# Patient Record
Sex: Male | Born: 1955 | Race: Black or African American | Hispanic: No | Marital: Single | Smoking: Current some day smoker
Health system: Southern US, Community
[De-identification: ages and names within clinical notes are randomized; demographics above are authoritative.]

## PROBLEM LIST (undated history)

## (undated) DIAGNOSIS — G629 Polyneuropathy, unspecified: Secondary | ICD-10-CM

## (undated) DIAGNOSIS — E119 Type 2 diabetes mellitus without complications: Secondary | ICD-10-CM

## (undated) DIAGNOSIS — K219 Gastro-esophageal reflux disease without esophagitis: Secondary | ICD-10-CM

## (undated) DIAGNOSIS — I10 Essential (primary) hypertension: Secondary | ICD-10-CM

## (undated) DIAGNOSIS — I219 Acute myocardial infarction, unspecified: Secondary | ICD-10-CM

## (undated) DIAGNOSIS — I509 Heart failure, unspecified: Secondary | ICD-10-CM

---

## 2003-05-30 ENCOUNTER — Emergency Department (HOSPITAL_COMMUNITY): Admission: EM | Admit: 2003-05-30 | Discharge: 2003-05-30 | Payer: Self-pay

## 2017-03-16 ENCOUNTER — Encounter (HOSPITAL_COMMUNITY): Payer: Self-pay

## 2017-03-16 ENCOUNTER — Emergency Department (HOSPITAL_COMMUNITY)
Admission: EM | Admit: 2017-03-16 | Discharge: 2017-03-16 | Disposition: A | Discharge status: Home / Self Care | Payer: Non-veteran care | Attending: Emergency Medicine | Admitting: Emergency Medicine

## 2017-03-16 DIAGNOSIS — I110 Hypertensive heart disease with heart failure: Secondary | ICD-10-CM | POA: Insufficient documentation

## 2017-03-16 DIAGNOSIS — M79675 Pain in left toe(s): Secondary | ICD-10-CM | POA: Diagnosis present

## 2017-03-16 DIAGNOSIS — Z7982 Long term (current) use of aspirin: Secondary | ICD-10-CM | POA: Insufficient documentation

## 2017-03-16 DIAGNOSIS — I252 Old myocardial infarction: Secondary | ICD-10-CM | POA: Diagnosis not present

## 2017-03-16 DIAGNOSIS — L97521 Non-pressure chronic ulcer of other part of left foot limited to breakdown of skin: Secondary | ICD-10-CM

## 2017-03-16 DIAGNOSIS — Z79899 Other long term (current) drug therapy: Secondary | ICD-10-CM | POA: Diagnosis not present

## 2017-03-16 DIAGNOSIS — Z794 Long term (current) use of insulin: Secondary | ICD-10-CM | POA: Insufficient documentation

## 2017-03-16 DIAGNOSIS — E1140 Type 2 diabetes mellitus with diabetic neuropathy, unspecified: Secondary | ICD-10-CM | POA: Insufficient documentation

## 2017-03-16 DIAGNOSIS — F17210 Nicotine dependence, cigarettes, uncomplicated: Secondary | ICD-10-CM | POA: Diagnosis not present

## 2017-03-16 DIAGNOSIS — I509 Heart failure, unspecified: Secondary | ICD-10-CM | POA: Insufficient documentation

## 2017-03-16 HISTORY — DX: Polyneuropathy, unspecified: G62.9

## 2017-03-16 HISTORY — DX: Gastro-esophageal reflux disease without esophagitis: K21.9

## 2017-03-16 HISTORY — DX: Essential (primary) hypertension: I10

## 2017-03-16 HISTORY — DX: Type 2 diabetes mellitus without complications: E11.9

## 2017-03-16 HISTORY — DX: Heart failure, unspecified: I50.9

## 2017-03-16 HISTORY — DX: Acute myocardial infarction, unspecified: I21.9

## 2017-03-16 NOTE — ED Provider Notes (Signed)
[facility] Provider Note   CSN: [id number] Arrival date & time: 03/16/17  1611     History   Chief Complaint Chief Complaint  Patient presents with  . Toe Pain    HPI Waymon Laser is a 61 y.o. male.  HPI Patient reports he's had a wound on his left great toe for several months. He is try to get treatment from the [place] but reports that he has had difficulty getting appointments and is gotten very frustrated. He reports he wants to get some treatment. He states it causes him pain. Patient reports he wears a loosefitting sneaker shoe and has not had specific trauma that he knows of. No fever chills or general illness. Significant change in the wound over the past several weeks. Past Medical History:  Diagnosis Date  . CHF (congestive heart failure) (HCC)   . Diabetes mellitus without complication (HCC)   . Gastric reflux   . Hypertension   . MI (myocardial infarction)   . Neuropathy (HCC)     There are no active problems to display for this patient.   History reviewed. No pertinent surgical history.     Home Medications    Prior to Admission medications   Medication Sig Start Date End Date Taking? Authorizing Provider  aspirin 81 MG chewable tablet Chew 81 mg by mouth daily.   Yes Historical Provider, MD  atorvastatin (LIPITOR) 20 MG tablet Take 20 mg by mouth at bedtime.   Yes Historical Provider, MD  carvedilol (COREG) 6.25 MG tablet Take 6.25 mg by mouth 2 (two) times daily with a meal.   Yes Historical Provider, MD  furosemide (LASIX) 40 MG tablet Take 40 mg by mouth 2 (two) times daily.   Yes Historical Provider, MD  gabapentin (NEURONTIN) 400 MG capsule Take 800 mg by mouth 3 (three) times daily.   Yes Historical Provider, MD  insulin aspart (NOVOLOG) 100 UNIT/ML injection Inject 10 Units into the skin 2 (two) times daily before a meal.   Yes Historical Provider, MD  insulin glargine (LANTUS) 100 UNIT/ML injection Inject 20 Units into the skin daily.   Yes  Historical Provider, MD  lisinopril (PRINIVIL,ZESTRIL) 2.5 MG tablet Take 2.5 mg by mouth daily.   Yes Historical Provider, MD  pantoprazole (PROTONIX) 40 MG tablet Take 40 mg by mouth daily before breakfast.   Yes Historical Provider, MD  torsemide (DEMADEX) 20 MG tablet Take 20 mg by mouth 2 (two) times daily.   Yes Historical Provider, MD    Family History Family History  Problem Relation Age of Onset  . Anemia Mother   . Hypertension Mother   . Chronic Renal Failure Mother   . Cancer Father     Social History Social History  Substance Use Topics  . Smoking status: Current Some Day Smoker    Types: Cigarettes  . Smokeless tobacco: Never Used  . Alcohol use No     Allergies   Patient has no known allergies.   Review of Systems Review of Systems 10 Systems reviewed and are negative for acute change except as noted in the HPI.   Physical Exam Updated Vital Signs BP (!) 129/97 (BP Location: Left Arm)   Pulse 90   Temp 98.4 F (36.9 C) (Oral)   Resp 16   Ht 6' (1.829 m)   Wt 151 lb 2 oz (68.5 kg)   SpO2 99%   BMI 20.50 kg/m   Physical Exam  Constitutional: He is oriented to person, place, and  time. He appears well-developed and well-nourished. No distress.  HENT:  Head: Normocephalic and atraumatic.  Eyes: EOM are normal.  Pulmonary/Chest: Effort normal.  Musculoskeletal:  Patient has approximately 1+ edema bilateral lower extremities. He has skin thinning and hairlessness. Toes have dry thickened nails. There are some dry scaling skin on the soles of the feet and the toes. The great toe has a 1 cm, dry hard eschar on the tip and slightly to the medial aspect. The tip of the toe has a firm hard callus. Patient does not exhibit any pain as I palpate and press on this wound. There is no fluctuance or erythema. No signs of any purulent material beneath the eschar. Completely dry and firm.  Neurological: He is alert and oriented to person, place, and time. No cranial  nerve deficit. He exhibits normal muscle tone. Coordination normal.  Skin: Skin is warm and dry.  Psychiatric: He has a normal mood and affect.     ED Treatments / Results  Labs (all labs ordered are listed, but only abnormal results are displayed) Labs Reviewed - No data to display  EKG  EKG Interpretation None       Radiology No results found.  Procedures Procedures (including critical care time)  Medications Ordered in ED Medications - No data to display   Initial Impression / Assessment and Plan / ED Course  I have reviewed the triage vital signs and the nursing notes.  Pertinent labs & imaging results that were available during my care of the patient were reviewed by me and considered in my medical decision making (see chart for details).      Final Clinical Impressions(s) / ED Diagnoses   Final diagnoses:  Skin ulcer of toe of left foot, limited to breakdown of skin (HCC)   Patient has a dry firm ulcer on his toe without any signs of secondary infection or gangrenous changes. The ulceration is limited to the tip of the toe. Patient is diabetic. By exam I suspect peripheral vascular disease. Skin is thin and shiny with dryness and scaling of the foot. This has been chronic and stable. Patient is frustrated by inability to get timely follow-up care at the [place]. Time I have counseled him on protecting the ulcer with a dry dressing, avoiding injury to it and avoiding soaking or applying emollients to the wound. He is given information for contacting wound care clinic in podiatry locally. New Prescriptions New Prescriptions   No medications on file     Arby Barrette, MD 03/16/17 [phone]

## 2017-03-16 NOTE — ED Notes (Signed)
Pt toe clean with clorhexadine cleanse and  dry gauze dressing applied.

## 2017-03-16 NOTE — ED Notes (Signed)
Bed: [facility] Expected date:  Expected time:  Means of arrival:  Comments: 

## 2017-03-16 NOTE — ED Notes (Signed)
Bed: [facility] Expected date:  Expected time:  Means of arrival:  Comments: 

## 2017-03-16 NOTE — ED Notes (Signed)
Md at bedside at this time

## 2017-03-16 NOTE — ED Notes (Signed)
Pt stating he needs to leave now and does not have time to sign for his dc papers or get dc vitals. Pt did allow this RN to review dc paperwork however and he understands how to follow up

## 2017-03-16 NOTE — ED Triage Notes (Signed)
Per EMS- Patient has been residing at [facility]. Patient has swollen great and second toe on the left foot.. Patient has a history of diabetes.

## 2017-04-09 ENCOUNTER — Encounter (HOSPITAL_COMMUNITY): Payer: Self-pay

## 2017-04-09 ENCOUNTER — Emergency Department (HOSPITAL_COMMUNITY)
Admission: EM | Admit: 2017-04-09 | Discharge: 2017-04-09 | Discharge status: Against Medical Advice | Payer: Non-veteran care | Attending: Emergency Medicine | Admitting: Emergency Medicine

## 2017-04-09 ENCOUNTER — Emergency Department (HOSPITAL_COMMUNITY): Payer: Non-veteran care

## 2017-04-09 DIAGNOSIS — Z7982 Long term (current) use of aspirin: Secondary | ICD-10-CM | POA: Diagnosis not present

## 2017-04-09 DIAGNOSIS — I509 Heart failure, unspecified: Secondary | ICD-10-CM | POA: Diagnosis not present

## 2017-04-09 DIAGNOSIS — I110 Hypertensive heart disease with heart failure: Secondary | ICD-10-CM | POA: Insufficient documentation

## 2017-04-09 DIAGNOSIS — F17210 Nicotine dependence, cigarettes, uncomplicated: Secondary | ICD-10-CM | POA: Insufficient documentation

## 2017-04-09 DIAGNOSIS — E1140 Type 2 diabetes mellitus with diabetic neuropathy, unspecified: Secondary | ICD-10-CM | POA: Diagnosis not present

## 2017-04-09 DIAGNOSIS — Z794 Long term (current) use of insulin: Secondary | ICD-10-CM | POA: Insufficient documentation

## 2017-04-09 DIAGNOSIS — I252 Old myocardial infarction: Secondary | ICD-10-CM | POA: Diagnosis not present

## 2017-04-09 DIAGNOSIS — E1165 Type 2 diabetes mellitus with hyperglycemia: Secondary | ICD-10-CM | POA: Insufficient documentation

## 2017-04-09 DIAGNOSIS — R739 Hyperglycemia, unspecified: Secondary | ICD-10-CM

## 2017-04-09 DIAGNOSIS — R600 Localized edema: Secondary | ICD-10-CM | POA: Diagnosis not present

## 2017-04-09 LAB — CBC
HCT: 38.9 % — ABNORMAL LOW (ref 39.0–52.0)
HEMOGLOBIN: 12.7 g/dL — AB (ref 13.0–17.0)
MCH: 30.8 pg (ref 26.0–34.0)
MCHC: 32.6 g/dL (ref 30.0–36.0)
MCV: 94.4 fL (ref 78.0–100.0)
PLATELETS: 191 10*3/uL (ref 150–400)
RBC: 4.12 MIL/uL — AB (ref 4.22–5.81)
RDW: 14.3 % (ref 11.5–15.5)
WBC: 5.4 10*3/uL (ref 4.0–10.5)

## 2017-04-09 LAB — BASIC METABOLIC PANEL
ANION GAP: 9 (ref 5–15)
BUN: 33 mg/dL — ABNORMAL HIGH (ref 6–20)
CALCIUM: 8.2 mg/dL — AB (ref 8.9–10.3)
CO2: 26 mmol/L (ref 22–32)
CREATININE: 1.63 mg/dL — AB (ref 0.61–1.24)
Chloride: 100 mmol/L — ABNORMAL LOW (ref 101–111)
GFR, EST AFRICAN AMERICAN: 51 mL/min — AB (ref >60)
GFR, EST NON AFRICAN AMERICAN: 44 mL/min — AB (ref >60)
Glucose, Bld: 421 mg/dL — ABNORMAL HIGH (ref 65–99)
POTASSIUM: 4.6 mmol/L (ref 3.5–5.1)
Sodium: 135 mmol/L (ref 135–145)

## 2017-04-09 LAB — CBG MONITORING, ED: GLUCOSE-CAPILLARY: 417 mg/dL — AB (ref 65–99)

## 2017-04-09 MED ORDER — INSULIN ASPART 100 UNIT/ML ~~LOC~~ SOLN
10.0000 [IU] | Freq: Once | SUBCUTANEOUS | Status: AC
  Administered 2017-04-09: 10 [IU] via SUBCUTANEOUS
  Filled 2017-04-09: qty 1

## 2017-04-09 MED ORDER — HYDROCODONE-ACETAMINOPHEN 5-325 MG PO TABS
1.0000 | ORAL_TABLET | Freq: Once | ORAL | Status: AC
  Administered 2017-04-09: 1 via ORAL
  Filled 2017-04-09: qty 1

## 2017-04-09 NOTE — ED Notes (Signed)
Pt discharged by doctor, but left before me could receive d/c instructions

## 2017-04-09 NOTE — ED Provider Notes (Signed)
[facility] Provider Note   CSN: [id number] Arrival date & time: 04/09/17  0144  By signing my name below, I, Bing Neighbors., attest that this documentation has been prepared under the direction and in the presence of Zadie Rhine, MD. Electronically signed: Bing Neighbors., ED Scribe. 04/09/17. 2:24 AM.    History   Chief Complaint Chief Complaint  Patient presents with  . Knee Pain    Left  . Hyperglycemia    HPI Russell Foster is a 61 y.o. male with hx of diabetes mellitus who presents to the Emergency Department bib [facility] complaining of L knee pain with onset x2 days. Pt states that he was at [facility] and has since had increasing L knee pain . He states that when he put weight on the L knee he fell to the floor. He states that the L knee is TTP. Pt denies any modifying factors. He denies fever, vomiting, chest pain, SOB and vomiting. Of note, pt is being seen at the [facility] in x2 days to check his lower extremities for blood clots. Pt denies ever being treated for DVT. He reportedly refused taking his insulin today. Pt states that [facility].   HPI  Past Medical History:  Diagnosis Date  . CHF (congestive heart failure) (HCC)   . Diabetes mellitus without complication (HCC)   . Gastric reflux   . Hypertension   . MI (myocardial infarction) (HCC)   . Neuropathy     There are no active problems to display for this patient.   History reviewed. No pertinent surgical history.     Home Medications    Prior to Admission medications   Medication Sig Start Date End Date Taking? Authorizing Provider  aspirin 81 MG chewable tablet Chew 81 mg by mouth daily.    Historical Provider, MD  atorvastatin (LIPITOR) 20 MG tablet Take 20 mg by mouth at bedtime.    Historical Provider, MD  carvedilol (COREG) 6.25 MG tablet Take 6.25 mg by mouth 2 (two) times daily with a meal.    Historical Provider, MD    furosemide (LASIX) 40 MG tablet Take 40 mg by mouth 2 (two) times daily.    Historical Provider, MD  gabapentin (NEURONTIN) 400 MG capsule Take 800 mg by mouth 3 (three) times daily.    Historical Provider, MD  insulin aspart (NOVOLOG) 100 UNIT/ML injection Inject 10 Units into the skin 2 (two) times daily before a meal.    Historical Provider, MD  insulin glargine (LANTUS) 100 UNIT/ML injection Inject 20 Units into the skin daily.    Historical Provider, MD  lisinopril (PRINIVIL,ZESTRIL) 2.5 MG tablet Take 2.5 mg by mouth daily.    Historical Provider, MD  pantoprazole (PROTONIX) 40 MG tablet Take 40 mg by mouth daily before breakfast.    Historical Provider, MD  torsemide (DEMADEX) 20 MG tablet Take 20 mg by mouth 2 (two) times daily.    Historical Provider, MD    Family History Family History  Problem Relation Age of Onset  . Anemia Mother   . Hypertension Mother   . Chronic Renal Failure Mother   . Cancer Father     Social History Social History  Substance Use Topics  . Smoking status: Current Some Day Smoker    Types: Cigarettes  . Smokeless tobacco: Never Used  . Alcohol use No     Allergies   Patient has no known allergies.  Review of Systems Review of Systems  Constitutional: Negative for fever.  Respiratory: Negative for shortness of breath.   Cardiovascular: Positive for leg swelling. Negative for chest pain.  Gastrointestinal: Negative for nausea and vomiting.  Musculoskeletal: Positive for arthralgias (L knee).  All other systems reviewed and are negative.    Physical Exam Updated Vital Signs BP (!) 138/100 (BP Location: Left Arm)   Pulse 100   Temp 98 F (36.7 C) (Oral)   Resp 16   SpO2 99%   Physical Exam  CONSTITUTIONAL: Chronically ill-appearing but no acute distress HEAD: Normocephalic/atraumatic EYES: EOMI/PERRL ENMT: Mucous membranes moist NECK: supple no meningeal signs SPINE/BACK:entire spine nontender CV: S1/S2 noted, no  murmurs/rubs/gallops noted LUNGS: Lungs are clear to auscultation bilaterally, no apparent distress ABDOMEN: soft, nontender GU:no cva tenderness NEURO: Pt is awake/alert/appropriate, moves all extremitiesx4.  No facial droop.   EXTREMITIES: pulses normal/equal, full ROM, tenderness and edema to L lower extremity, edema is greater in the L lower extremity, no deformity or effusion noted to L knee. No erythema noted to lower extremity SKIN: warm, color normal PSYCH: no abnormalities of mood noted, alert and oriented to situation  ED Treatments / Results   DIAGNOSTIC STUDIES: Oxygen Saturation is 99% on RA, normal by my interpretation.   COORDINATION OF CARE: 2:25 AM-Discussed next steps with pt. Pt verbalized understanding and is agreeable with the plan.    Labs (all labs ordered are listed, but only abnormal results are displayed) Labs Reviewed  BASIC METABOLIC PANEL - Abnormal; Notable for the following:       Result Value   Chloride 100 (*)    Glucose, Bld 421 (*)    BUN 33 (*)    Creatinine, Ser 1.63 (*)    Calcium 8.2 (*)    GFR calc non Af Amer 44 (*)    GFR calc Af Amer 51 (*)    All other components within normal limits  CBC - Abnormal; Notable for the following:    RBC 4.12 (*)    Hemoglobin 12.7 (*)    HCT 38.9 (*)    All other components within normal limits  CBG MONITORING, ED - Abnormal; Notable for the following:    Glucose-Capillary 417 (*)    All other components within normal limits  URINALYSIS, ROUTINE W REFLEX MICROSCOPIC    EKG  EKG Interpretation None       Radiology Dg Knee Complete 4 Views Left  Result Date: 04/09/2017 CLINICAL DATA:  Posterior knee pain for 1 week with swelling of the knee. EXAM: LEFT KNEE - COMPLETE 4+ VIEW COMPARISON:  None. FINDINGS: Soft tissue induration and swelling is noted about the visualized thigh, knee and proximal calf. There is medial femorotibial joint space narrowing with minimal spurring of the lateral  tibial spine. No joint effusion. No acute fracture bone destruction. Suggestion of mild periosteum reaction along the proximal tibial shaft raising the question of venous stasis. IMPRESSION: Nonspecific diffuse soft tissue swelling about the left knee question venous stasis given periosteal thickening along the tibial shaft. Electronically Signed   By: Tollie Eth M.D.   On: 04/09/2017 03:12    Procedures Procedures (including critical care time)  Medications Ordered in ED Medications  HYDROcodone-acetaminophen (NORCO/VICODIN) 5-325 MG per tablet 1 tablet (1 tablet Oral Given 04/09/17 0309)  insulin aspart (novoLOG) injection 10 Units (10 Units Subcutaneous Given 04/09/17 0309)     Initial Impression / Assessment and Plan / ED Course  I have reviewed the triage vital  signs and the nursing notes.  Pertinent labs & imaging results that were available during my care of the patient were reviewed by me and considered in my medical decision making (see chart for details).     4:18 AM Review of chart Pt seen at [facility] on 4/21 DVT study of left LE - negative He was noted to be in CHF and was diuresed at that time  When considering recent workup and his clinical appearance tonight, pt appears near baseline He did have hyperglycemia and was given insulin However, xray was negative for fracture and no clinical signs of cellulitis Since he had recent negative DVT study, this was not re-ordered I feel he is appropriate for d/c home  At time of discharge, he requested narcotic pain meds, but when I declined to prescribe this and advised PCP followup he became very angry and left ED prior to my discharge paperwork was given    Final Clinical Impressions(s) / ED Diagnoses   Final diagnoses:  Edema of left lower extremity  Hyperglycemia    New Prescriptions New Prescriptions   No medications on file   I personally performed the services described in this documentation, which was scribed  in my presence. The recorded information has been reviewed and is accurate.       Zadie Rhine, MD 04/09/17 [phone]

## 2017-04-09 NOTE — ED Notes (Signed)
Pt came to [facility], asked for doctor. Stood their for several minutes. Didn't make any indication of being in pain.

## 2017-04-09 NOTE — ED Notes (Signed)
Pt in hallway in bathrobe walking out, when asked if they would like to talk to the doctor before they left, pt stated "the doctor can kiss my ass". "I am never coming back to this place with this racist ass doctor." Iv removed before leaving.

## 2017-04-09 NOTE — ED Notes (Signed)
CBG: 417 

## 2017-04-09 NOTE — ED Triage Notes (Signed)
BIB EMS from [facility] w/ c/o increased pain and swelling to left knee. Upon EMS arrival to scene pts BSG 535. Pt refused taking his insulin today.   EMS Vitals BP 138/103 P 99 SPO2 100%

## 2017-04-19 ENCOUNTER — Encounter (HOSPITAL_COMMUNITY): Payer: Self-pay | Admitting: Emergency Medicine

## 2017-04-19 ENCOUNTER — Emergency Department (HOSPITAL_COMMUNITY): Payer: Non-veteran care

## 2017-04-19 ENCOUNTER — Observation Stay (HOSPITAL_COMMUNITY)
Admission: EM | Admit: 2017-04-19 | Discharge: 2017-04-20 | Discharge status: Against Medical Advice | Payer: Non-veteran care | Attending: Internal Medicine | Admitting: Internal Medicine

## 2017-04-19 DIAGNOSIS — E1165 Type 2 diabetes mellitus with hyperglycemia: Secondary | ICD-10-CM | POA: Diagnosis present

## 2017-04-19 DIAGNOSIS — I130 Hypertensive heart and chronic kidney disease with heart failure and stage 1 through stage 4 chronic kidney disease, or unspecified chronic kidney disease: Secondary | ICD-10-CM | POA: Insufficient documentation

## 2017-04-19 DIAGNOSIS — Z7982 Long term (current) use of aspirin: Secondary | ICD-10-CM | POA: Diagnosis not present

## 2017-04-19 DIAGNOSIS — R4182 Altered mental status, unspecified: Secondary | ICD-10-CM | POA: Diagnosis not present

## 2017-04-19 DIAGNOSIS — E1140 Type 2 diabetes mellitus with diabetic neuropathy, unspecified: Secondary | ICD-10-CM | POA: Insufficient documentation

## 2017-04-19 DIAGNOSIS — Z79899 Other long term (current) drug therapy: Secondary | ICD-10-CM | POA: Diagnosis not present

## 2017-04-19 DIAGNOSIS — F17210 Nicotine dependence, cigarettes, uncomplicated: Secondary | ICD-10-CM | POA: Diagnosis not present

## 2017-04-19 DIAGNOSIS — E1122 Type 2 diabetes mellitus with diabetic chronic kidney disease: Secondary | ICD-10-CM | POA: Diagnosis not present

## 2017-04-19 DIAGNOSIS — I5042 Chronic combined systolic (congestive) and diastolic (congestive) heart failure: Secondary | ICD-10-CM | POA: Insufficient documentation

## 2017-04-19 DIAGNOSIS — I252 Old myocardial infarction: Secondary | ICD-10-CM | POA: Diagnosis not present

## 2017-04-19 DIAGNOSIS — N183 Chronic kidney disease, stage 3 unspecified: Secondary | ICD-10-CM | POA: Diagnosis present

## 2017-04-19 DIAGNOSIS — R739 Hyperglycemia, unspecified: Secondary | ICD-10-CM

## 2017-04-19 DIAGNOSIS — Z9119 Patient's noncompliance with other medical treatment and regimen: Secondary | ICD-10-CM | POA: Insufficient documentation

## 2017-04-19 DIAGNOSIS — Z8249 Family history of ischemic heart disease and other diseases of the circulatory system: Secondary | ICD-10-CM | POA: Insufficient documentation

## 2017-04-19 DIAGNOSIS — Z809 Family history of malignant neoplasm, unspecified: Secondary | ICD-10-CM | POA: Insufficient documentation

## 2017-04-19 DIAGNOSIS — Z832 Family history of diseases of the blood and blood-forming organs and certain disorders involving the immune mechanism: Secondary | ICD-10-CM | POA: Diagnosis not present

## 2017-04-19 DIAGNOSIS — Z794 Long term (current) use of insulin: Secondary | ICD-10-CM | POA: Insufficient documentation

## 2017-04-19 DIAGNOSIS — G92 Toxic encephalopathy: Secondary | ICD-10-CM | POA: Diagnosis not present

## 2017-04-19 DIAGNOSIS — N289 Disorder of kidney and ureter, unspecified: Secondary | ICD-10-CM

## 2017-04-19 DIAGNOSIS — G929 Unspecified toxic encephalopathy: Secondary | ICD-10-CM

## 2017-04-19 DIAGNOSIS — F14929 Cocaine use, unspecified with intoxication, unspecified: Secondary | ICD-10-CM | POA: Diagnosis present

## 2017-04-19 DIAGNOSIS — K219 Gastro-esophageal reflux disease without esophagitis: Secondary | ICD-10-CM | POA: Insufficient documentation

## 2017-04-19 LAB — DIFFERENTIAL
BASOS ABS: 0 10*3/uL (ref 0.0–0.1)
Basophils Relative: 0 %
EOS ABS: 0.1 10*3/uL (ref 0.0–0.7)
EOS PCT: 1 %
LYMPHS ABS: 1.2 10*3/uL (ref 0.7–4.0)
Lymphocytes Relative: 22 %
Monocytes Absolute: 0.5 10*3/uL (ref 0.1–1.0)
Monocytes Relative: 9 %
NEUTROS PCT: 68 %
Neutro Abs: 3.6 10*3/uL (ref 1.7–7.7)

## 2017-04-19 LAB — CBC
HCT: 41.6 % (ref 39.0–52.0)
Hemoglobin: 13.1 g/dL (ref 13.0–17.0)
MCH: 30.7 pg (ref 26.0–34.0)
MCHC: 31.5 g/dL (ref 30.0–36.0)
MCV: 97.4 fL (ref 78.0–100.0)
PLATELETS: 201 10*3/uL (ref 150–400)
RBC: 4.27 MIL/uL (ref 4.22–5.81)
RDW: 14.5 % (ref 11.5–15.5)
WBC: 5.4 10*3/uL (ref 4.0–10.5)

## 2017-04-19 LAB — BRAIN NATRIURETIC PEPTIDE: B NATRIURETIC PEPTIDE 5: 3965.5 pg/mL — AB (ref 0.0–100.0)

## 2017-04-19 NOTE — ED Provider Notes (Signed)
[facility]-EMERGENCY DEPT Provider Note   CSN: [id number] Arrival date & time: 04/19/17  2238  By signing my name below, I, Linna DarnerRussell Turner, attest that this documentation has been prepared under the direction and in the presence of physician practitioner, Dione BoozeGlick, Junaid Wurzer, MD. Electronically Signed: Linna Darnerussell Turner, Scribe. 04/19/2017. 11:15 PM.  History   Chief Complaint Chief Complaint  Patient presents with  . Leg Swelling  . Hyperglycemia   The history is provided by the patient. No language interpreter was used.    HPI Comments: LEVEL 5 CAVEAT FOR ALTERED MENTAL STATUS Russell Foster is a 61 y.o. male with PMHx including DM, CHF, HTN, and MI who presents to the Emergency Department via EMS with complaints of bilateral lower leg swelling and hyperglycemia for an unspecified period of time. Per the nursing staff, patient was A&O x4 en route with EMS tonight but has been "non-compliant" and disoriented since arrival to the ED. There are no further complaints noted as patient is refusing to answer questions during evaluation.   Past Medical History:  Diagnosis Date  . CHF (congestive heart failure) (HCC)   . Diabetes mellitus without complication (HCC)   . Gastric reflux   . Hypertension   . MI (myocardial infarction) (HCC)   . Neuropathy     There are no active problems to display for this patient.   History reviewed. No pertinent surgical history.     Home Medications    Prior to Admission medications   Medication Sig Start Date End Date Taking? Authorizing Provider  aspirin 81 MG chewable tablet Chew 81 mg by mouth daily.    [provider]  atorvastatin (LIPITOR) 20 MG tablet Take 20 mg by mouth at bedtime.    [provider]  carvedilol (COREG) 6.25 MG tablet Take 6.25 mg by mouth 2 (two) times daily with a meal.    [provider]  furosemide (LASIX) 40 MG tablet Take 40 mg by mouth 2 (two) times daily.    [provider]  gabapentin  (NEURONTIN) 400 MG capsule Take 800 mg by mouth 3 (three) times daily.    [provider]  insulin aspart (NOVOLOG) 100 UNIT/ML injection Inject 10 Units into the skin 2 (two) times daily before a meal.    [provider]  insulin glargine (LANTUS) 100 UNIT/ML injection Inject 20 Units into the skin daily.    [provider]  lisinopril (PRINIVIL,ZESTRIL) 2.5 MG tablet Take 2.5 mg by mouth daily.    [provider]  pantoprazole (PROTONIX) 40 MG tablet Take 40 mg by mouth daily before breakfast.    [provider]  torsemide (DEMADEX) 20 MG tablet Take 20 mg by mouth 2 (two) times daily.    [provider]    Family History Family History  Problem Relation Age of Onset  . Anemia Mother   . Hypertension Mother   . Chronic Renal Failure Mother   . Cancer Father     Social History Social History  Substance Use Topics  . Smoking status: Current Some Day Smoker    Types: Cigarettes  . Smokeless tobacco: Never Used  . Alcohol use No     Allergies   Patient has no known allergies.   Review of Systems Review of Systems  Unable to perform ROS: Mental status change   Physical Exam Updated Vital Signs BP (!) 143/101   Pulse 93   Temp 97.9 F (36.6 C) (Oral)   Resp 20   SpO2 100%  Physical Exam  Constitutional: He appears well-developed and well-nourished.  HENT:  Head: Normocephalic and atraumatic.  Eyes: EOM are normal. Pupils are equal, round, and reactive to light.  Neck: Normal range of motion. Neck supple. JVD present.  Cardiovascular: Normal rate, regular rhythm and normal heart sounds.   No murmur heard. Pulmonary/Chest: Effort normal and breath sounds normal. He has no wheezes. He has no rales. He exhibits no tenderness.  Abdominal: Soft. Bowel sounds are normal. He exhibits no distension and no mass. There is tenderness. There is no rebound and no guarding.  Mild tenderness across the upper abdomen.    Musculoskeletal: Normal range of motion. He exhibits edema.  1-2+ pretibial edema with symmetric calf circumference.  Lymphadenopathy:    He has no cervical adenopathy.  Neurological: He is alert. No cranial nerve deficit. He exhibits normal muscle tone. Coordination normal.  Uncooperative but oriented to person. Will not answer questions. No focal neurologic findings.  Skin: Skin is warm and dry. No rash noted.  Nursing note and vitals reviewed.  ED Treatments / Results  Labs (all labs ordered are listed, but only abnormal results are displayed) Labs Reviewed  BASIC METABOLIC PANEL - Abnormal; Notable for the following:       Result Value   Sodium 126 (*)    Potassium 5.6 (*)    Chloride 91 (*)    Glucose, Bld 776 (*)    BUN 48 (*)    Creatinine, Ser 1.85 (*)    Calcium 8.6 (*)    GFR calc non Af Amer 38 (*)    GFR calc Af Amer 44 (*)    All other components within normal limits  URINALYSIS, ROUTINE W REFLEX MICROSCOPIC - Abnormal; Notable for the following:    Glucose, UA >=500 (*)    Hgb urine dipstick SMALL (*)    Protein, ur 30 (*)    Squamous Epithelial / LPF 0-5 (*)    All other components within normal limits  HEPATIC FUNCTION PANEL - Abnormal; Notable for the following:    Albumin 3.1 (*)    AST 43 (*)    Alkaline Phosphatase 182 (*)    Bilirubin, Direct 0.6 (*)    All other components within normal limits  RAPID URINE DRUG SCREEN, HOSP PERFORMED - Abnormal; Notable for the following:    Cocaine POSITIVE (*)    All other components within normal limits  BRAIN NATRIURETIC PEPTIDE - Abnormal; Notable for the following:    B Natriuretic Peptide 3,965.5 (*)    All other components within normal limits  CBG MONITORING, ED - Abnormal; Notable for the following:    Glucose-Capillary >600 (*)    All other components within normal limits  CBG MONITORING, ED - Abnormal; Notable for the following:    Glucose-Capillary 568 (*)    All other components within normal  limits  CBC  LIPASE, BLOOD  DIFFERENTIAL  TROPONIN I  TROPONIN I  TROPONIN I    EKG  EKG Interpretation  Date/Time:  Sunday Apr 20 2017 03:25:14 EDT Ventricular Rate:  89 PR Interval:    QRS Duration: 114 QT Interval:  395 QTC Calculation: 481 R Axis:   -33 Text Interpretation:  Sinus rhythm Probable left atrial enlargement Abnormal R-wave progression, late transition LVH with secondary repolarization abnormality Borderline prolonged QT interval Baseline wander in lead(s) V1 V3 V4 V5 V6 No old tracing to compare Confirmed by [facility]  MD, Daaiyah Baumert ([id number]) on 04/20/2017 3:32:17 AM  Radiology Ct Head Wo Contrast  Result Date: 04/20/2017 CLINICAL DATA:  Bilateral lower leg swelling and hyperglycemia, disoriented EXAM: CT HEAD WITHOUT CONTRAST TECHNIQUE: Contiguous axial images were obtained from the base of the skull through the vertex without intravenous contrast. COMPARISON:  None. FINDINGS: Brain: No hemorrhage or intracranial mass is visualized. There is a wedge-shaped focal hypodensity in the left cerebellum. Mild atrophy. The ventricles are nonenlarged. Vascular: No hyperdense vessels. Scattered calcifications at the carotid siphons. Skull: Normal. Negative for fracture or focal lesion. Sinuses/Orbits: No acute finding.  Possible old nasal bone deformity Other: None IMPRESSION: Small focal hypodensity in the left cerebellum is suspicious for a focal area of edema or possible infarct. MRI is recommended for further evaluation. Electronically Signed   By: Jasmine Pang M.D.   On: 04/20/2017 02:51   Dg Chest Port 1 View  Result Date: 04/20/2017 CLINICAL DATA:  Bilateral lower extremity swelling. Hyperglycemia. Some disorientation tonight. EXAM: PORTABLE CHEST 1 VIEW COMPARISON:  None. FINDINGS: Moderate cardiomegaly, unchanged. The lungs are clear. The pulmonary vasculature is normal. Hilar and mediastinal contours are unremarkable and unchanged. No pleural effusions. IMPRESSION: Stable  cardiomegaly.  No consolidation or effusion. Electronically Signed   By: Ellery Plunk M.D.   On: 04/20/2017 00:07    Procedures Procedures (including critical care time) CRITICAL CARE Performed by: WGNFA,OZHYQ Total critical care time: 45 minutes Critical care time was exclusive of separately billable procedures and treating other patients. Critical care was necessary to treat or prevent imminent or life-threatening deterioration. Critical care was time spent personally by me on the following activities: development of treatment plan with patient and/or surrogate as well as nursing, discussions with consultants, evaluation of patient's response to treatment, examination of patient, obtaining history from patient or surrogate, ordering and performing treatments and interventions, ordering and review of laboratory studies, ordering and review of radiographic studies, pulse oximetry and re-evaluation of patient's condition.  DIAGNOSTIC STUDIES: Oxygen Saturation is 100% on RA, normal by my interpretation.    Medications Ordered in ED Medications  dextrose 5 %-0.45 % sodium chloride infusion (not administered)  insulin regular bolus via infusion 0-10 Units (not administered)  insulin regular (NOVOLIN R,HUMULIN R) 250 Units in sodium chloride 0.9 % 250 mL (1 Units/mL) infusion (10.2 Units/hr Intravenous Rate/Dose Change 04/20/17 0255)  dextrose 50 % solution 25 mL (not administered)  0.9 %  sodium chloride infusion ( Intravenous New Bag/Given 04/20/17 0201)     Initial Impression / Assessment and Plan / ED Course  I have reviewed the triage vital signs and the nursing notes.  Pertinent labs & imaging results that were available during my care of the patient were reviewed by me and considered in my medical decision making (see chart for details).  Patient presented with complaints of leg swelling but is uncooperative on exam. Findings are suggestive of possible CHF with neck vein distention,  and mild tenderness across the upper abdomen. Review of old records shows that he has been evaluated for leg swelling secondary to CHF in the past. Recent admission at [facility] for non-STEMI secondary to cocaine abuse. Long-standing history of cardiac myopathy with decreased ejection fraction. He is noted to be hyperglycemic in the ED but does not demonstrate small respirations. Screening labs are obtained including cardiac and GI labs. Will hold off on fluids until I can better ascertain his fluid status.  Laboratory workup shows significant hyperglycemia without evidence of ketoacidosis or hyperosmolar state. He continues to have altered mentation, so will  be sent for CT of head. Drug screen was positive for cocaine but not other medications. Creatinine is mildly elevated and this is significantly above his baseline. Anticipate need for inpatient management to optimize his creatinine. BNP is markedly elevated, and I do not feel he needs significant hydration at this point.  CT is significant for possible left cerebellar infarct. He continues to be poorly responsive when I can't a CMP over, nursing reports that in between, he has gotten up and tried to eat some sausages, and MB related to the bathroom without difficulty. At this point, I feel that he needs to be admitted to control his blood sugar and to further evaluate his mental status and CT findings. Case is discussed with Dr. Julian Reil [facility] who agrees to admit the patient.  Final Clinical Impressions(s) / ED Diagnoses   Final diagnoses:  Hyperglycemia  Renal insufficiency  Altered mental status, unspecified altered mental status type    New Prescriptions New Prescriptions   No medications on file   I personally performed the services described in this documentation, which was scribed in my presence. The recorded information has been reviewed and is accurate.      Dione Booze, MD 04/20/17 [phone]

## 2017-04-19 NOTE — ED Notes (Signed)
Bed: WA07 Expected date:  Expected time:  Means of arrival:  Comments: EMS 3461 M Hyperglycemia and left leg edema

## 2017-04-19 NOTE — ED Triage Notes (Signed)
Patient BIB [facility] from home for left leg edema and pain as well as high blood sugar. CBG red HIGH with EMS. History of DM. A/O x4.

## 2017-04-20 ENCOUNTER — Encounter (HOSPITAL_COMMUNITY): Payer: Self-pay | Admitting: Emergency Medicine

## 2017-04-20 ENCOUNTER — Encounter (HOSPITAL_COMMUNITY): Payer: Self-pay | Admitting: Oncology

## 2017-04-20 ENCOUNTER — Emergency Department (HOSPITAL_COMMUNITY)
Admission: EM | Admit: 2017-04-20 | Discharge: 2017-04-20 | Disposition: A | Discharge status: Home / Self Care | Payer: Non-veteran care | Source: Home / Self Care | Attending: Emergency Medicine | Admitting: Emergency Medicine

## 2017-04-20 ENCOUNTER — Emergency Department (HOSPITAL_COMMUNITY): Payer: Non-veteran care

## 2017-04-20 ENCOUNTER — Emergency Department (HOSPITAL_COMMUNITY)
Admission: EM | Admit: 2017-04-20 | Discharge: 2017-04-20 | Disposition: A | Discharge status: Home / Self Care | Payer: Non-veteran care | Attending: Emergency Medicine | Admitting: Emergency Medicine

## 2017-04-20 DIAGNOSIS — F14929 Cocaine use, unspecified with intoxication, unspecified: Secondary | ICD-10-CM

## 2017-04-20 DIAGNOSIS — Z79899 Other long term (current) drug therapy: Secondary | ICD-10-CM | POA: Insufficient documentation

## 2017-04-20 DIAGNOSIS — Z794 Long term (current) use of insulin: Secondary | ICD-10-CM

## 2017-04-20 DIAGNOSIS — N183 Chronic kidney disease, stage 3 unspecified: Secondary | ICD-10-CM | POA: Diagnosis present

## 2017-04-20 DIAGNOSIS — E1165 Type 2 diabetes mellitus with hyperglycemia: Secondary | ICD-10-CM

## 2017-04-20 DIAGNOSIS — G92 Toxic encephalopathy: Secondary | ICD-10-CM | POA: Diagnosis not present

## 2017-04-20 DIAGNOSIS — Z7982 Long term (current) use of aspirin: Secondary | ICD-10-CM | POA: Insufficient documentation

## 2017-04-20 DIAGNOSIS — M2550 Pain in unspecified joint: Secondary | ICD-10-CM

## 2017-04-20 DIAGNOSIS — I5042 Chronic combined systolic (congestive) and diastolic (congestive) heart failure: Secondary | ICD-10-CM

## 2017-04-20 DIAGNOSIS — I130 Hypertensive heart and chronic kidney disease with heart failure and stage 1 through stage 4 chronic kidney disease, or unspecified chronic kidney disease: Secondary | ICD-10-CM | POA: Insufficient documentation

## 2017-04-20 DIAGNOSIS — F17210 Nicotine dependence, cigarettes, uncomplicated: Secondary | ICD-10-CM | POA: Insufficient documentation

## 2017-04-20 DIAGNOSIS — G929 Unspecified toxic encephalopathy: Secondary | ICD-10-CM | POA: Diagnosis present

## 2017-04-20 DIAGNOSIS — I509 Heart failure, unspecified: Secondary | ICD-10-CM

## 2017-04-20 DIAGNOSIS — I252 Old myocardial infarction: Secondary | ICD-10-CM

## 2017-04-20 DIAGNOSIS — N289 Disorder of kidney and ureter, unspecified: Secondary | ICD-10-CM | POA: Diagnosis not present

## 2017-04-20 DIAGNOSIS — E1122 Type 2 diabetes mellitus with diabetic chronic kidney disease: Secondary | ICD-10-CM

## 2017-04-20 DIAGNOSIS — E1140 Type 2 diabetes mellitus with diabetic neuropathy, unspecified: Secondary | ICD-10-CM | POA: Insufficient documentation

## 2017-04-20 DIAGNOSIS — R4182 Altered mental status, unspecified: Secondary | ICD-10-CM

## 2017-04-20 DIAGNOSIS — R739 Hyperglycemia, unspecified: Secondary | ICD-10-CM

## 2017-04-20 DIAGNOSIS — Z5900 Homelessness unspecified: Secondary | ICD-10-CM

## 2017-04-20 DIAGNOSIS — F329 Major depressive disorder, single episode, unspecified: Secondary | ICD-10-CM | POA: Insufficient documentation

## 2017-04-20 DIAGNOSIS — F32A Depression, unspecified: Secondary | ICD-10-CM

## 2017-04-20 DIAGNOSIS — R609 Edema, unspecified: Secondary | ICD-10-CM

## 2017-04-20 LAB — URINALYSIS, ROUTINE W REFLEX MICROSCOPIC
Bacteria, UA: NONE SEEN
Bilirubin Urine: NEGATIVE
Ketones, ur: NEGATIVE mg/dL
LEUKOCYTES UA: NEGATIVE
Nitrite: NEGATIVE
PH: 5 (ref 5.0–8.0)
Protein, ur: 30 mg/dL — AB
Specific Gravity, Urine: 1.024 (ref 1.005–1.030)

## 2017-04-20 LAB — RAPID URINE DRUG SCREEN, HOSP PERFORMED
AMPHETAMINES: NOT DETECTED
BARBITURATES: NOT DETECTED
BENZODIAZEPINES: NOT DETECTED
Cocaine: POSITIVE — AB
Opiates: NOT DETECTED
Tetrahydrocannabinol: NOT DETECTED

## 2017-04-20 LAB — BASIC METABOLIC PANEL
Anion gap: 9 (ref 5–15)
BUN: 48 mg/dL — AB (ref 6–20)
CALCIUM: 8.6 mg/dL — AB (ref 8.9–10.3)
CO2: 26 mmol/L (ref 22–32)
Chloride: 91 mmol/L — ABNORMAL LOW (ref 101–111)
Creatinine, Ser: 1.85 mg/dL — ABNORMAL HIGH (ref 0.61–1.24)
GFR calc Af Amer: 44 mL/min — ABNORMAL LOW (ref >60)
GFR, EST NON AFRICAN AMERICAN: 38 mL/min — AB (ref >60)
GLUCOSE: 776 mg/dL — AB (ref 65–99)
POTASSIUM: 5.6 mmol/L — AB (ref 3.5–5.1)
Sodium: 126 mmol/L — ABNORMAL LOW (ref 135–145)

## 2017-04-20 LAB — HEPATIC FUNCTION PANEL
ALBUMIN: 3.1 g/dL — AB (ref 3.5–5.0)
ALK PHOS: 182 U/L — AB (ref 38–126)
ALT: 28 U/L (ref 17–63)
AST: 43 U/L — ABNORMAL HIGH (ref 15–41)
Bilirubin, Direct: 0.6 mg/dL — ABNORMAL HIGH (ref 0.1–0.5)
Indirect Bilirubin: 0.4 mg/dL (ref 0.3–0.9)
TOTAL PROTEIN: 7.2 g/dL (ref 6.5–8.1)
Total Bilirubin: 1 mg/dL (ref 0.3–1.2)

## 2017-04-20 LAB — CBG MONITORING, ED
GLUCOSE-CAPILLARY: 568 mg/dL — AB (ref 65–99)
Glucose-Capillary: >600 mg/dL (ref 65–99)
Glucose-Capillary: 311 mg/dL — ABNORMAL HIGH (ref 65–99)

## 2017-04-20 LAB — LIPASE, BLOOD: Lipase: 41 U/L (ref 11–51)

## 2017-04-20 MED ORDER — DEXTROSE 50 % IV SOLN: 25.0000 mL | INTRAVENOUS | Status: DC | PRN

## 2017-04-20 MED ORDER — DEXTROSE-NACL 5-0.45 % IV SOLN: INTRAVENOUS | Status: DC

## 2017-04-20 MED ORDER — PANTOPRAZOLE SODIUM 40 MG PO TBEC: 40.0000 mg | DELAYED_RELEASE_TABLET | Freq: Every day | ORAL | Status: DC

## 2017-04-20 MED ORDER — INSULIN REGULAR HUMAN 100 UNIT/ML IJ SOLN
INTRAMUSCULAR | Status: DC
  Administered 2017-04-20: 5.4 [IU]/h via INTRAVENOUS
  Filled 2017-04-20: qty 2.5

## 2017-04-20 MED ORDER — INSULIN REGULAR BOLUS VIA INFUSION
0.0000 [IU] | Freq: Three times a day (TID) | INTRAVENOUS | Status: DC
  Filled 2017-04-20: qty 10

## 2017-04-20 MED ORDER — ENOXAPARIN SODIUM 40 MG/0.4ML ~~LOC~~ SOLN: 40.0000 mg | SUBCUTANEOUS | Status: DC

## 2017-04-20 MED ORDER — SODIUM CHLORIDE 0.9 % IV SOLN
INTRAVENOUS | Status: DC
  Administered 2017-04-20: 02:00:00 via INTRAVENOUS

## 2017-04-20 MED ORDER — ATORVASTATIN CALCIUM 20 MG PO TABS: 20.0000 mg | ORAL_TABLET | Freq: Every day | ORAL | Status: DC

## 2017-04-20 MED ORDER — LISINOPRIL 2.5 MG PO TABS: 2.5000 mg | ORAL_TABLET | Freq: Every day | ORAL | Status: DC

## 2017-04-20 MED ORDER — ASPIRIN 81 MG PO CHEW: 81.0000 mg | CHEWABLE_TABLET | Freq: Every day | ORAL | Status: DC

## 2017-04-20 NOTE — ED Notes (Signed)
Pt has in his belongings bag a pair of white and red sneakers, a pair of blue pants in his pockets he has two dollars a watch shoe polish and military id and regular id. He has a black cell phone and a blue shirt.

## 2017-04-20 NOTE — ED Notes (Signed)
Bed: WA04 Expected date:  Expected time:  Means of arrival:  Comments: Ems 

## 2017-04-20 NOTE — H&P (Signed)
History and Physical    Russell Foster ZOX:[id number] DOB: September 14, 1956 DOA: 04/19/2017  PCP: Patient, No Pcp Per  Patient coming from: Home  I have personally briefly [facility]  Chief Complaint: AMS  HPI: Russell Foster is a 61 y.o. male with medical history significant of DM, CHF, HTN, and MI who presents to the Emergency Department via EMS with complaints of bilateral lower leg swelling and hyperglycemia for an unspecified period of time. Per the nursing staff, patient was A&O x4 en route with EMS tonight but has been "non-compliant" and disoriented since arrival to the ED. There are no further complaints noted as patient is refusing to answer questions during evaluation.   ED Course: CT head shows incidental finding of hypodensity in cerebellum.  Patient has ambulated at least once to the bathroom while in ED and was eating earlier.  He has also urinated on the floor of his room and refused to answer questions.   Review of Systems: Unable to perform due to AMS  Past Medical History:  Diagnosis Date  . CHF (congestive heart failure) (HCC)   . Diabetes mellitus without complication (HCC)   . Gastric reflux   . Hypertension   . MI (myocardial infarction) (HCC)   . Neuropathy     History reviewed. No pertinent surgical history.   reports that he has been smoking Cigarettes.  He has never used smokeless tobacco. He reports that he does not drink alcohol or use drugs.  No Known Allergies  Family History  Problem Relation Age of Onset  . Anemia Mother   . Hypertension Mother   . Chronic Renal Failure Mother   . Cancer Father      Prior to Admission medications   Medication Sig Start Date End Date Taking? Authorizing Provider  aspirin 81 MG chewable tablet Chew 81 mg by mouth daily.    [provider]  atorvastatin (LIPITOR) 20 MG tablet Take 20 mg by mouth at bedtime.    [provider]  carvedilol (COREG)  6.25 MG tablet Take 6.25 mg by mouth 2 (two) times daily with a meal.    [provider]  furosemide (LASIX) 40 MG tablet Take 40 mg by mouth 2 (two) times daily.    [provider]  gabapentin (NEURONTIN) 400 MG capsule Take 800 mg by mouth 3 (three) times daily.    [provider]  insulin aspart (NOVOLOG) 100 UNIT/ML injection Inject 10 Units into the skin 2 (two) times daily before a meal.    [provider]  insulin glargine (LANTUS) 100 UNIT/ML injection Inject 20 Units into the skin daily.    [provider]  lisinopril (PRINIVIL,ZESTRIL) 2.5 MG tablet Take 2.5 mg by mouth daily.    [provider]  pantoprazole (PROTONIX) 40 MG tablet Take 40 mg by mouth daily before breakfast.    [provider]  torsemide (DEMADEX) 20 MG tablet Take 20 mg by mouth 2 (two) times daily.    [provider]    Physical Exam: Vitals:   04/19/17 2248 04/20/17 0035 04/20/17 0130 04/20/17 0200  BP: (!) 143/101 (!) 139/104 124/89 (!) 141/102  Pulse: 93 94 91 90  Resp: 20 18    Temp: 97.9 F (36.6 C)     TempSrc: Oral     SpO2: 100% 96% 98% 92%    Constitutional: NAD, calm, comfortable Eyes: PERRL, lids and conjunctivae normal ENMT: Mucous membranes are moist. Posterior pharynx  clear of any exudate or lesions.Normal dentition.  Neck: normal, supple, no masses, no thyromegaly Respiratory: clear to auscultation bilaterally, no wheezing, no crackles. Normal respiratory effort. No accessory muscle use.  Cardiovascular: Regular rate and rhythm, no murmurs / rubs / gallops. No extremity edema. 2+ pedal pulses. No carotid bruits.  Abdomen: no tenderness, no masses palpated. No hepatosplenomegaly. Bowel sounds positive.  Musculoskeletal: no clubbing / cyanosis. No joint deformity upper and lower extremities. Good ROM, no contractures. Normal muscle tone.  Skin: no rashes, lesions, ulcers. No induration Neurologic: Grossly  non-focal Psychiatric: Not answering questions at this time.   Labs on Admission: I have personally reviewed following labs and imaging studies  CBC:  Recent Labs Lab 04/19/17 2245  WBC 5.4  NEUTROABS 3.6  HGB 13.1  HCT 41.6  MCV 97.4  PLT 201   Basic Metabolic Panel:  Recent Labs Lab 04/19/17 2245  NA 126*  K 5.6*  CL 91*  CO2 26  GLUCOSE 776*  BUN 48*  CREATININE 1.85*  CALCIUM 8.6*   GFR: CrCl cannot be calculated (Unknown ideal weight.). Liver Function Tests:  Recent Labs Lab 04/19/17 2245  AST 43*  ALT 28  ALKPHOS 182*  BILITOT 1.0  PROT 7.2  ALBUMIN 3.1*    Recent Labs Lab 04/19/17 2245  LIPASE 41   No results for input(s): AMMONIA in the last 168 hours. Coagulation Profile: No results for input(s): INR, PROTIME in the last 168 hours. Cardiac Enzymes: No results for input(s): CKTOTAL, CKMB, CKMBINDEX, TROPONINI in the last 168 hours. BNP (last 3 results) No results for input(s): PROBNP in the last 8760 hours. HbA1C: No results for input(s): HGBA1C in the last 72 hours. CBG:  Recent Labs Lab 04/20/17 0121 04/20/17 0249  GLUCAP >600* 568*   Lipid Profile: No results for input(s): CHOL, HDL, LDLCALC, TRIG, CHOLHDL, LDLDIRECT in the last 72 hours. Thyroid Function Tests: No results for input(s): TSH, T4TOTAL, FREET4, T3FREE, THYROIDAB in the last 72 hours. Anemia Panel: No results for input(s): VITAMINB12, FOLATE, FERRITIN, TIBC, IRON, RETICCTPCT in the last 72 hours. Urine analysis:    Component Value Date/Time   COLORURINE YELLOW 04/19/2017 2245   APPEARANCEUR CLEAR 04/19/2017 2245   LABSPEC 1.024 04/19/2017 2245   PHURINE 5.0 04/19/2017 2245   GLUCOSEU >=500 (A) 04/19/2017 2245   HGBUR SMALL (A) 04/19/2017 2245   BILIRUBINUR NEGATIVE 04/19/2017 2245   KETONESUR NEGATIVE 04/19/2017 2245   PROTEINUR 30 (A) 04/19/2017 2245   NITRITE NEGATIVE 04/19/2017 2245   LEUKOCYTESUR NEGATIVE 04/19/2017 2245    Radiological Exams on  Admission: Ct Head Wo Contrast  Result Date: 04/20/2017 CLINICAL DATA:  Bilateral lower leg swelling and hyperglycemia, disoriented EXAM: CT HEAD WITHOUT CONTRAST TECHNIQUE: Contiguous axial images were obtained from the base of the skull through the vertex without intravenous contrast. COMPARISON:  None. FINDINGS: Brain: No hemorrhage or intracranial mass is visualized. There is a wedge-shaped focal hypodensity in the left cerebellum. Mild atrophy. The ventricles are nonenlarged. Vascular: No hyperdense vessels. Scattered calcifications at the carotid siphons. Skull: Normal. Negative for fracture or focal lesion. Sinuses/Orbits: No acute finding.  Possible old nasal bone deformity Other: None IMPRESSION: Small focal hypodensity in the left cerebellum is suspicious for a focal area of edema or possible infarct. MRI is recommended for further evaluation. Electronically Signed   By: Jasmine Pang M.D.   On: 04/20/2017 02:51   Dg Chest Port 1 View  Result Date: 04/20/2017 CLINICAL DATA:  Bilateral lower extremity swelling. Hyperglycemia. Some  disorientation tonight. EXAM: PORTABLE CHEST 1 VIEW COMPARISON:  None. FINDINGS: Moderate cardiomegaly, unchanged. The lungs are clear. The pulmonary vasculature is normal. Hilar and mediastinal contours are unremarkable and unchanged. No pleural effusions. IMPRESSION: Stable cardiomegaly.  No consolidation or effusion. Electronically Signed   By: Ellery Plunk M.D.   On: 04/20/2017 00:07    EKG: Independently reviewed.  Assessment/Plan Principal Problem:   Toxic encephalopathy Active Problems:   CKD (chronic kidney disease) stage 3, GFR 30-59 ml/min   Cocaine intoxication (HCC)   Hyperglycemia due to type 2 diabetes mellitus (HCC)   Chronic combined systolic and diastolic CHF (congestive heart failure) (HCC)    1. Toxic encephalopathy - strongly suspect cocaine / psych induced encephalopathy 1. However will admit for MRI brain to rule out stroke given CT  findings 2. Hyperglycemia due to DM2 - 1. glucostabilizer 2. Will "hydrate" patient by holding lasix 3. However will hold off on IVF for the moment given his extensive cardiac history, EF of 15% etc. 3. CHF - continue home meds except lasix 4. CKD stage 3 - chronic and stable.  DVT prophylaxis: Lovenox Code Status: Full Family Communication: No family in room Disposition Plan: Home when ready Consults called: None Admission status: Place in 62, Heywood Iles. DO [facility] Pager [phone]  If 7AM-7PM, please contact day team taking care of patient [email or web address] Password [email or web address]  04/20/2017, 3:21 AM

## 2017-04-20 NOTE — ED Notes (Signed)
Patient refused to sign discharge information stating we need to keep him here so he can eat. Advised that cafeteria was open however at this time he was medically discharged. Patient closed his eyes and would not respond when spoken to stating "are you still here"

## 2017-04-20 NOTE — ED Provider Notes (Signed)
[facility]-EMERGENCY DEPT Provider Note   CSN: [id number] Arrival date & time: 04/20/17  1137     History   Chief Complaint Chief Complaint  Patient presents with  . Suicidal  . Joint Swelling    HPI Russell Carns is a 61 y.o. male.  HPI Patient reports ongoing recurrent joint pain.  He was seen in the emergency department earlier this morning and discharged.  EMS was called secondary to ongoing left knee swelling.  He states no one seems to be helping him and he feels like he might just need to hurt himself if no one can help him.  He is homeless.  He states he has nowhere to go.  He reports she's been walking for 2 days.  He was recently a homeless shelter and was kicked out of the homeless shelter.  He states he's hungry and tired and needs to rest and needs to sleep.  He has no plan of suicide.  He's never tried to kill himself before.  He reports ongoing left knee pain chronically   Past Medical History:  Diagnosis Date  . CHF (congestive heart failure) (HCC)   . Diabetes mellitus without complication (HCC)   . Gastric reflux   . Hypertension   . MI (myocardial infarction) (HCC)   . Neuropathy     Patient Active Problem List   Diagnosis Date Noted  . CKD (chronic kidney disease) stage 3, GFR 30-59 ml/min 04/20/2017  . Cocaine intoxication (HCC) 04/20/2017  . Toxic encephalopathy 04/20/2017  . Hyperglycemia due to type 2 diabetes mellitus (HCC) 04/20/2017  . Chronic combined systolic and diastolic CHF (congestive heart failure) (HCC) 04/20/2017    History reviewed. No pertinent surgical history.     Home Medications    Prior to Admission medications   Medication Sig Start Date End Date Taking? Authorizing Provider  aspirin 81 MG chewable tablet Chew 81 mg by mouth daily.   Yes [provider]  atorvastatin (LIPITOR) 20 MG tablet Take 20 mg by mouth at bedtime.   Yes [provider]  carvedilol (COREG) 6.25 MG tablet Take 6.25 mg by mouth 2 (two)  times daily with a meal.   Yes [provider]  furosemide (LASIX) 80 MG tablet Take 80 mg by mouth 2 (two) times daily. 04/07/17 05/07/17 Yes [provider]  gabapentin (NEURONTIN) 400 MG capsule Take 800 mg by mouth 3 (three) times daily.   Yes [provider]  insulin glargine (LANTUS) 100 UNIT/ML injection Inject 20 Units into the skin daily.   Yes [provider]  insulin NPH-regular Human (NOVOLIN 70/30) (70-30) 100 UNIT/ML injection Inject 10 Units into the skin 2 (two) times daily. 03/04/17  Yes [provider]  pantoprazole (PROTONIX) 40 MG tablet Take 40 mg by mouth daily before breakfast.   Yes [provider]  sertraline (ZOLOFT) 25 MG tablet Take 25 mg by mouth daily. 03/05/17 03/05/18 Yes [provider]  torsemide (DEMADEX) 20 MG tablet Take 20 mg by mouth 2 (two) times daily.   Yes [provider]    Family History Family History  Problem Relation Age of Onset  . Anemia Mother   . Hypertension Mother   . Chronic Renal Failure Mother   . Cancer Father     Social History Social History  Substance Use Topics  . Smoking status: Current Some Day Smoker    Types: Cigarettes  . Smokeless tobacco: Never Used  . Alcohol use No     Allergies  Patient has no known allergies.   Review of Systems Review of Systems  All other systems reviewed and are negative.    Physical Exam Updated Vital Signs BP 109/75 (BP Location: Left Arm)   Pulse 95   Temp 98.8 F (37.1 C) (Oral)   Resp 18   SpO2 93%   Physical Exam  Constitutional: He is oriented to person, place, and time. He appears well-developed and well-nourished.  HENT:  Head: Normocephalic.  Eyes: EOM are normal.  Neck: Normal range of motion.  Pulmonary/Chest: Effort normal.  Abdominal: He exhibits no distension.  Musculoskeletal: Normal range of motion.  Full range of motion bilateral hips, knees, ankles.  Normal pulses bilaterally.  No  significant swelling appreciated of the left knee.  No warmth or erythema.  Neurological: He is alert and oriented to person, place, and time.  Psychiatric: He has a normal mood and affect.  Nursing note and vitals reviewed.    ED Treatments / Results  Labs (all labs ordered are listed, but only abnormal results are displayed) Labs Reviewed - No data to display  EKG  EKG Interpretation Russell Foster       Radiology Ct Head Wo Contrast  Result Date: 04/20/2017 CLINICAL DATA:  Bilateral lower leg swelling and hyperglycemia, disoriented EXAM: CT HEAD WITHOUT CONTRAST TECHNIQUE: Contiguous axial images were obtained from the base of the skull through the vertex without intravenous contrast. COMPARISON:  Russell Foster. FINDINGS: Brain: No hemorrhage or intracranial mass is visualized. There is a wedge-shaped focal hypodensity in the left cerebellum. Mild atrophy. The ventricles are nonenlarged. Vascular: No hyperdense vessels. Scattered calcifications at the carotid siphons. Skull: Normal. Negative for fracture or focal lesion. Sinuses/Orbits: No acute finding.  Possible old nasal bone deformity Other: Russell Foster IMPRESSION: Small focal hypodensity in the left cerebellum is suspicious for a focal area of edema or possible infarct. MRI is recommended for further evaluation. Electronically Signed   By: Jasmine PangKim  Fujinaga M.D.   On: 04/20/2017 02:51   Dg Chest Port 1 View  Result Date: 04/20/2017 CLINICAL DATA:  Bilateral lower extremity swelling. Hyperglycemia. Some disorientation tonight. EXAM: PORTABLE CHEST 1 VIEW COMPARISON:  Russell Foster. FINDINGS: Moderate cardiomegaly, unchanged. The lungs are clear. The pulmonary vasculature is normal. Hilar and mediastinal contours are unremarkable and unchanged. No pleural effusions. IMPRESSION: Stable cardiomegaly.  No consolidation or effusion. Electronically Signed   By: Ellery Plunkaniel R Mitchell M.D.   On: 04/20/2017 00:07    Procedures Procedures (including critical care time)  Medications  Ordered in ED Medications - No data to display   Initial Impression / Assessment and Plan / ED Course  I have reviewed the triage vital signs and the nursing notes.  Pertinent labs & imaging results that were available during my care of the patient were reviewed by me and considered in my medical decision making (see chart for details).     I do not believe the patient is a threat to himself or others.  I think he has recurrent pain which is part of his presentation today.  I also think he is homeless and is hungry and this is also leading to his vague comments regarding suicidal thoughts.  He cannot collaborate on this any further.  He has no specific plan.  I do think the patient is safe for discharge home.  [facility] to [facility].  He does not want additional resources for other homeless shelters in the community.  He states he just wants to sleep  Final Clinical Impressions(s) / ED  Diagnoses   Final diagnoses:  Depression, unspecified depression type  Homeless  Arthralgia, unspecified joint    New Prescriptions New Prescriptions   No medications on file     Azalia Bilis, MD 04/20/17 1232

## 2017-04-20 NOTE — ED Notes (Addendum)
Pt is A&O x 4. Pt ambulatory w/ a steady gait.  No sx of CVA. Pt is urinating in the floor.  Cursing the staff.  Dr. Julian ReilGardner is present and aware that pt is leaving AMA.  Pt refusing VS, or to sign AMA form.  Attempted to explain the risks of leaving AMA to which pt responded, "Shut your fucking mouth, and get your stinking breath out my face."  "I'm leaving."  Will d/c AMA.

## 2017-04-20 NOTE — Discharge Instructions (Signed)
Increase your torsemide to two tablets at a time (40 mg) twice a day for the next three days. After that, you will need to get instructions from your doctor at the [facility].

## 2017-04-20 NOTE — ED Notes (Signed)
Patient refusing to leave. Given food on way out. Escorted to bus stop with security and [employer]. Discharge instructions reviewed and placed in patients belonging bag.

## 2017-04-20 NOTE — ED Notes (Signed)
Pt now wishes to be admitted to the hospital. Pt very recently left AMA after being admitted for a possible stroke. Patient is stating they have pain in their left leg. Pt is asking for peanut butter during triage.

## 2017-04-20 NOTE — ED Notes (Signed)
Bed: WA21 Expected date:  Expected time:  Means of arrival:  Comments: RM 7

## 2017-04-20 NOTE — ED Provider Notes (Signed)
[facility]-EMERGENC[facility] DEPT Provider Note   CSN: [id number] Arrival date & time: 04/20/17  0406     History   Chief Complaint Chief Complaint  Patient presents with  . Hyperglycemia    HPI Russell Foster is a 61 y.o. male.  He is a very poor and uncooperative historian. He comes in complaining of pain and swelling in his legs but will not say for how long it has been bothering him. He had been in the emergency department earlier tonight with severe hyperglycemia and altered mental status. CT had shown a questionable cerebellar lesion and arrangements had been made to admit him. However, he got up and walked to the bathroom and then left AGAINST MEDICAL ADVICE. He returns but his not able to explain to me why he has returned. He does have a prescription for torsemide and states that he has been taking it twice a day as prescribed.   The history is provided by the patient and medical records.  Hyperglycemia    Past Medical History:  Diagnosis Date  . CHF (congestive heart failure) (HCC)   . Diabetes mellitus without complication (HCC)   . Gastric reflux   . Hypertension   . MI (myocardial infarction) (HCC)   . Neuropathy     Patient Active Problem List   Diagnosis Date Noted  . CKD (chronic kidney disease) stage 3, GFR 30-59 ml/min 04/20/2017  . Cocaine intoxication (HCC) 04/20/2017  . Toxic encephalopathy 04/20/2017  . Hyperglycemia due to type 2 diabetes mellitus (HCC) 04/20/2017  . Chronic combined systolic and diastolic CHF (congestive heart failure) (HCC) 04/20/2017    History reviewed. No pertinent surgical history.     Home Medications    Prior to Admission medications   Medication Sig Start Date End Date Taking? Authorizing Provider  aspirin 81 MG chewable tablet Chew 81 mg by mouth daily.    [provider]  atorvastatin (LIPITOR) 20 MG tablet Take 20 mg by mouth at bedtime.    [provider]  carvedilol (COREG) 6.25 MG tablet Take 6.25 mg by  mouth 2 (two) times daily with a meal.    [provider]  gabapentin (NEURONTIN) 400 MG capsule Take 800 mg by mouth 3 (three) times daily.    [provider]  insulin aspart (NOVOLOG) 100 UNIT/ML injection Inject 10 Units into the skin 2 (two) times daily before a meal.    [provider]  insulin glargine (LANTUS) 100 UNIT/ML injection Inject 20 Units into the skin daily.    [provider]  lisinopril (PRINIVIL,ZESTRIL) 2.5 MG tablet Take 2.5 mg by mouth daily.    [provider]  pantoprazole (PROTONIX) 40 MG tablet Take 40 mg by mouth daily before breakfast.    [provider]  torsemide (DEMADEX) 20 MG tablet Take 20 mg by mouth 2 (two) times daily.    [provider]    Family History Family History  Problem Relation Age of Onset  . Anemia Mother   . Hypertension Mother   . Chronic Renal Failure Mother   . Cancer Father     Social History Social History  Substance Use Topics  . Smoking status: Current Some Day Smoker    Types: Cigarettes  . Smokeless tobacco: Never Used  . Alcohol use No     Allergies   Patient has no known allergies.   Review of Systems Review of Systems  All other systems reviewed and are negative.    Physical Exam Updated Vital  Signs BP (!) 130/98 (BP Location: Right Arm)   Pulse 91   SpO2 95%   Physical Exam  Nursing note and vitals reviewed.  61 year old male, resting comfortably and in no acute distress. Vital signs are Significant for hypertension. Oxygen saturation is 95%, which is normal. Head is normocephalic and atraumatic. PERRLA, EOMI. Oropharynx is clear. Neck is nontender and supple without adenopathy. JVD is present. Back is nontender and there is no CVA tenderness. Lungs are clear without rales, wheezes, or rhonchi. Chest is nontender. Heart has regular rate and rhythm without murmur. Abdomen is soft, flat, nontender without masses or hepatosplenomegaly and  peristalsis is normoactive. Extremities have 1-2 edema, full range of motion is present. Circumference is symmetric. There is mild tenderness to palpation diffusely through the lower legs. Skin is warm and dry without rash. Neurologic: Mental status is normal, cranial nerves are intact, there are no motor or sensory deficits.  ED Treatments / Results  Labs (all labs ordered are listed, but only abnormal results are displayed) Labs Reviewed  CBG MONITORING, ED - Abnormal; Notable for the following:       Result Value   Glucose-Capillary 311 (*)    All other components within normal limits    Procedures Procedures (including critical care time)  Medications Ordered in ED Medications - No data to display   Initial Impression / Assessment and Plan / ED Course  I have reviewed the triage vital signs and the nursing notes.  Pertinent labs & imaging results that were available during my care of the patient were reviewed by me and considered in my medical decision making (see chart for details).  Peripheral edema. Review of prior records confirms recent ED visit and arrangements for hospitalization with patient leaving AGAINST MEDICAL ADVICE. He had that time, he was noted to have renal insufficiency which was unchanged from value 2 weeks ago, and severe hyperglycemia without evidence of ketoacidosis or hyperosmolar state. CT of head and showed questionable cerebellar lesion. However, patient was able to ambulate without any evidence of ataxia and this was felt to not represent an acute stroke. He did have markedly elevated BNP consistent with congestive heart failure. On return, patient shows no new findings. He is awake and alert and ambulatory without ataxia. However, as soon as he gets into the bed, he falls asleep. His peripheral edema seems to be related to his congestive heart failure. I have instructed him to increase his torsemide dose for the next 3 days and then follow-up with his  tremor care physician at the [facility]. Of note, glucose had fallen to 331, and I did not feel that this required any additional treatment.  Final Clinical Impressions(s) / ED Diagnoses   Final diagnoses:  Peripheral edema  Acute on chronic congestive heart failure, unspecified heart failure type [facility](HCC)  Hyperglycemia  Renal insufficiency    New Prescriptions Current Discharge Medication List       Dione BoozeGlick, Roshelle Traub, MD 04/20/17 [phone]

## 2017-04-20 NOTE — ED Notes (Addendum)
Critical Glucose of 776 EDP and RN made aware

## 2017-04-20 NOTE — Progress Notes (Addendum)
Patient ambulating in hall way, is AAOx4, but is being abusive to staff.  Ambulating unassisted.  At this point I feel it is unlikely his earlier 'encephalopathy' is related to a new major acute ischemic stroke.  Hey may end up leaving AMA, or may end up staying so we can treat his BGL which is still in the 500s at last check.

## 2017-04-20 NOTE — ED Triage Notes (Signed)
Per [facility] pt c/o left knee swelling. Pt was just discharged at 630 this am for same c/o. When questioned what changed since this am, pt states nothing "But if you aren't going to help me then I am going to have to by hurting myself."

## 2017-04-21 LAB — CBG MONITORING, ED

## 2017-10-01 ENCOUNTER — Inpatient Hospital Stay
Admit: 2017-10-01 | Discharge: 2017-10-01 | Disposition: A | Discharge status: Home / Self Care | Payer: PRIVATE HEALTH INSURANCE | Source: Home / Self Care | Attending: Emergency Medical Services

## 2017-10-01 DIAGNOSIS — Z5189 Encounter for other specified aftercare: Secondary | ICD-10-CM

## 2017-10-01 NOTE — ED Provider Notes
[facility]  Emergency Department Service Report    Triage     Rashaun Wichert, a 61 y.o. male, presents with Blunt Head (Hit head on garage door 1 week ago. C/O lump to forhead now. )    Arrived on 10/01/2017 at 7:10 AM   Arrived by Car [5]    ED Triage Vitals   Temp Temp Source BP Heart Rate Resp SpO2 O2 Device Pain Score Weight   10/01/17 0731 10/01/17 0731 10/01/17 0731 10/01/17 0731 10/01/17 0731 10/01/17 0731 10/01/17 0747 10/01/17 0731 10/01/17 0731   36.9 ???C (98.5 ???F) Oral 142/78 93 16 96 % None (Room air) Six (!) 114.8 kg (253 lb)       No Known Allergies     Initial Physician Contact       Comprehensive Exam Initiated  Contact Date: 10/01/17  Contact Time: 0752    History   HPI    Julis Haubner is a 61 y.o. male with no pertinent medical history, who presents with forehead lump status post blunt head injury 1 week ago. Patient states that 1 week ago, he stood up quickly and hit his forehead on the garage door. No loss of consciousness. No fall. No other injuries. Patient came to the ED to inquire whether there is a way to make the bump on his right upper forehead go away faster. Patient does not take any blood thinners.     No past medical history on file.     No past surgical history on file.     Past Family History   family history is not on file.     Past Social History   he reports that he has been smoking.  He has a 1.25 pack-year smoking history. He has never used smokeless tobacco. He reports that he drinks alcohol. His drug and sexual activity histories are not on file.     Review of Systems   Skin:        Raised bump on forehead   Neurological: Negative for headaches.     Physical Exam   Physical Exam   Constitutional: He is oriented to person, place, and time. He appears well-developed and well-nourished. No distress.   HENT:   Head: Normocephalic.   (+) Quarter-size raised, mobile, nontender bump over right forehead without sign of laceration. Eyes: Pupils are equal, round, and reactive to light. Conjunctivae and EOM are normal.   Neck: Normal range of motion. Neck supple.   Cardiovascular: Normal rate, regular rhythm and normal heart sounds.    Pulmonary/Chest: Effort normal and breath sounds normal. No respiratory distress.   Abdominal: Soft. There is no tenderness.   Musculoskeletal: Normal range of motion.   Neurological: He is alert and oriented to person, place, and time.   Skin: Skin is warm and dry.   Psychiatric: He has a normal mood and affect. His behavior is normal.   Nursing note and vitals reviewed.    Medical Decision Making   Kyri Dai is a 61 y.o. male with no pertinent medical history, who presents with forehead lump status post blunt head injury 1 week ago. Patient with post-traumatic head injury hematoma. Patient denies any other symptoms, counseled patient on watchful waiting and slow resorption of hematoma without need for further intervention. No other injuries. Advised patient to follow up with his primary care physician.    Plan: discharge with primary care physician follow up     Chart Review   Previous medical  records requested.  Pertinent items reviewed.    ED Course      Progress Notes / Reassessments   Time               Notes    7:55 AM Upon reassessment, patient is well-appearing and comfortable at this time, vital signs stable. Patient agrees with plan of care, questions answered, and strict returns precautions discussed. Discharge home.    ED Procedure Notes   None.    Any ED procedures performed are documented on separate ED procedure notes.    Clinical Impression     1. Visit for wound check      Disposition and Follow-up   Disposition: Discharge [1]    No future appointments.    Follow up with:  No follow-up provider specified.    Return precautions are specified on After Visit Summary.    New Prescriptions    No medications on file       Scribe Signature I, Katy Apo, have acted as a Stage manager for [employer] on behalf of Dr. Oman on 10/01/2017 at 8:01 AM.    Physician Signature     I have reviewed this note as recorded by SO, who acted as medical scribe, and I attest that it is an accurate representation of my H&P and other events of the ED visit except as otherwise noted.                 Oman, Moncerrath Berhe V., MD  10/14/17 [phone]

## 2017-10-01 NOTE — ED Notes
Patient arrived into room and dressed into gown, CICARE used for introduction. Patient states that he hit his head on his garage door 1 week ago, denies loc, KO, blurred vision, headache.  Patient has a small, quarter size bump to right forehead, ''It hasn't gone down, I want to make sure its ok''      Patient denies cp, sob, n/v/d

## 2017-10-01 NOTE — ED Notes
Patient axox4, discharge education completed on illness, medications, follow up, signs and symptoms to return to the ER. Patients belongings with patient upon discharge. Patients pain addressed and education given on pain reduction techniques. Patient ambulated out of Er with a steady gait alone

## 2018-08-05 IMAGING — CR DG KNEE COMPLETE 4+V*L*
4 series · 4 of 4 positions shown · non-contrast
Comparison: None.

CLINICAL DATA: Posterior knee pain for 1 week with swelling of the
knee.

EXAM:
LEFT KNEE - COMPLETE 4+ VIEW

[x knee ap left]
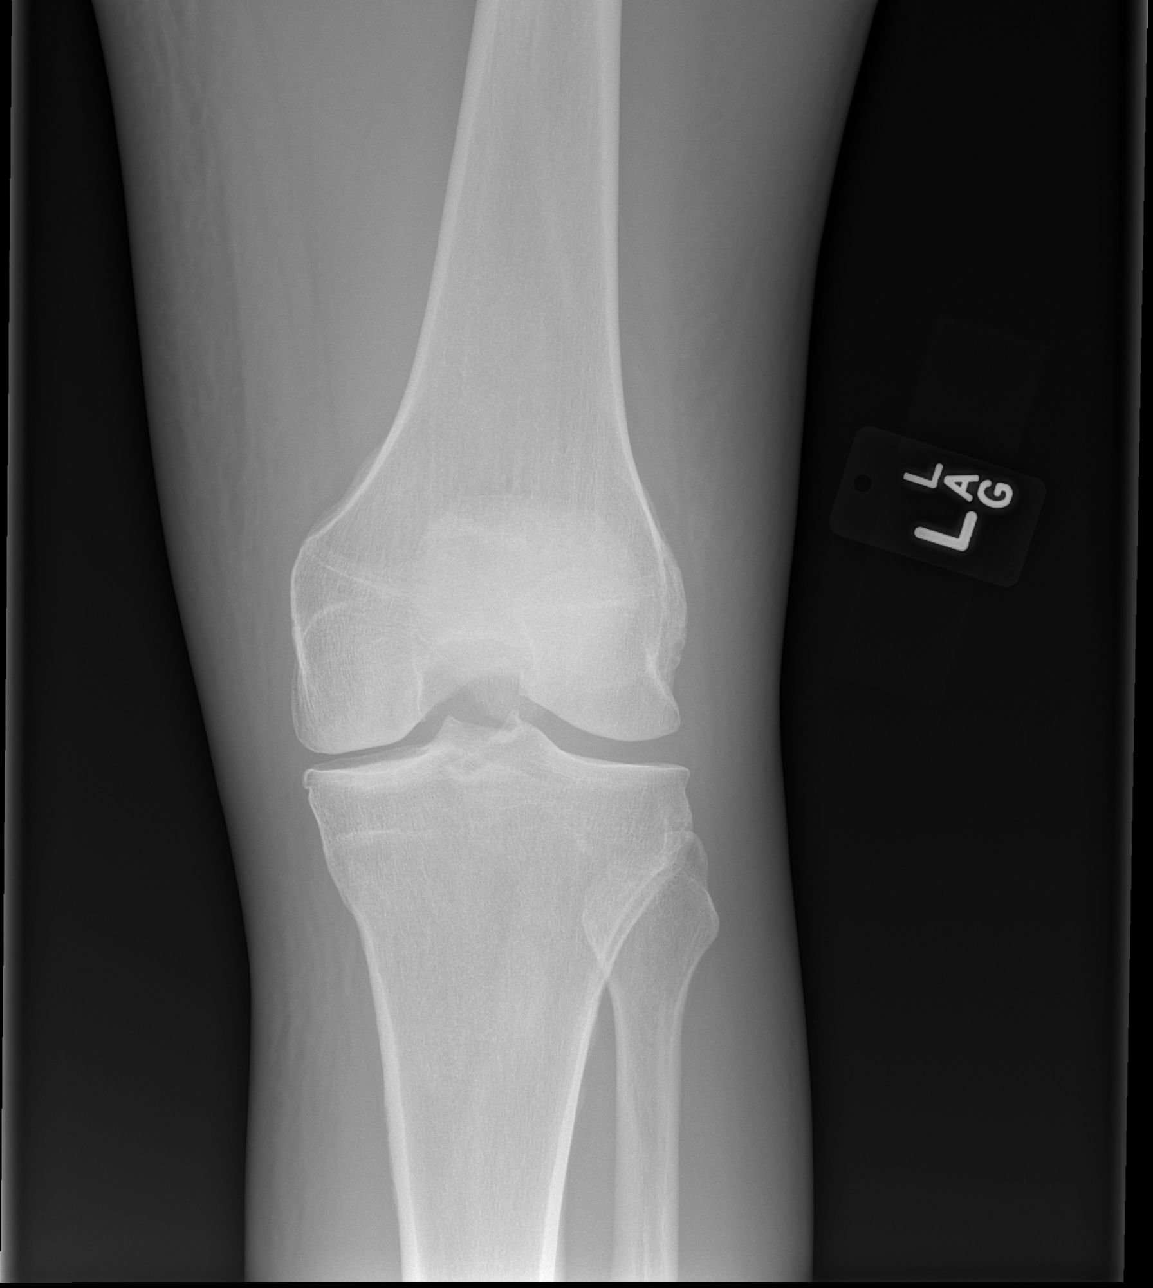

[x knee obl left (1 of 2)]
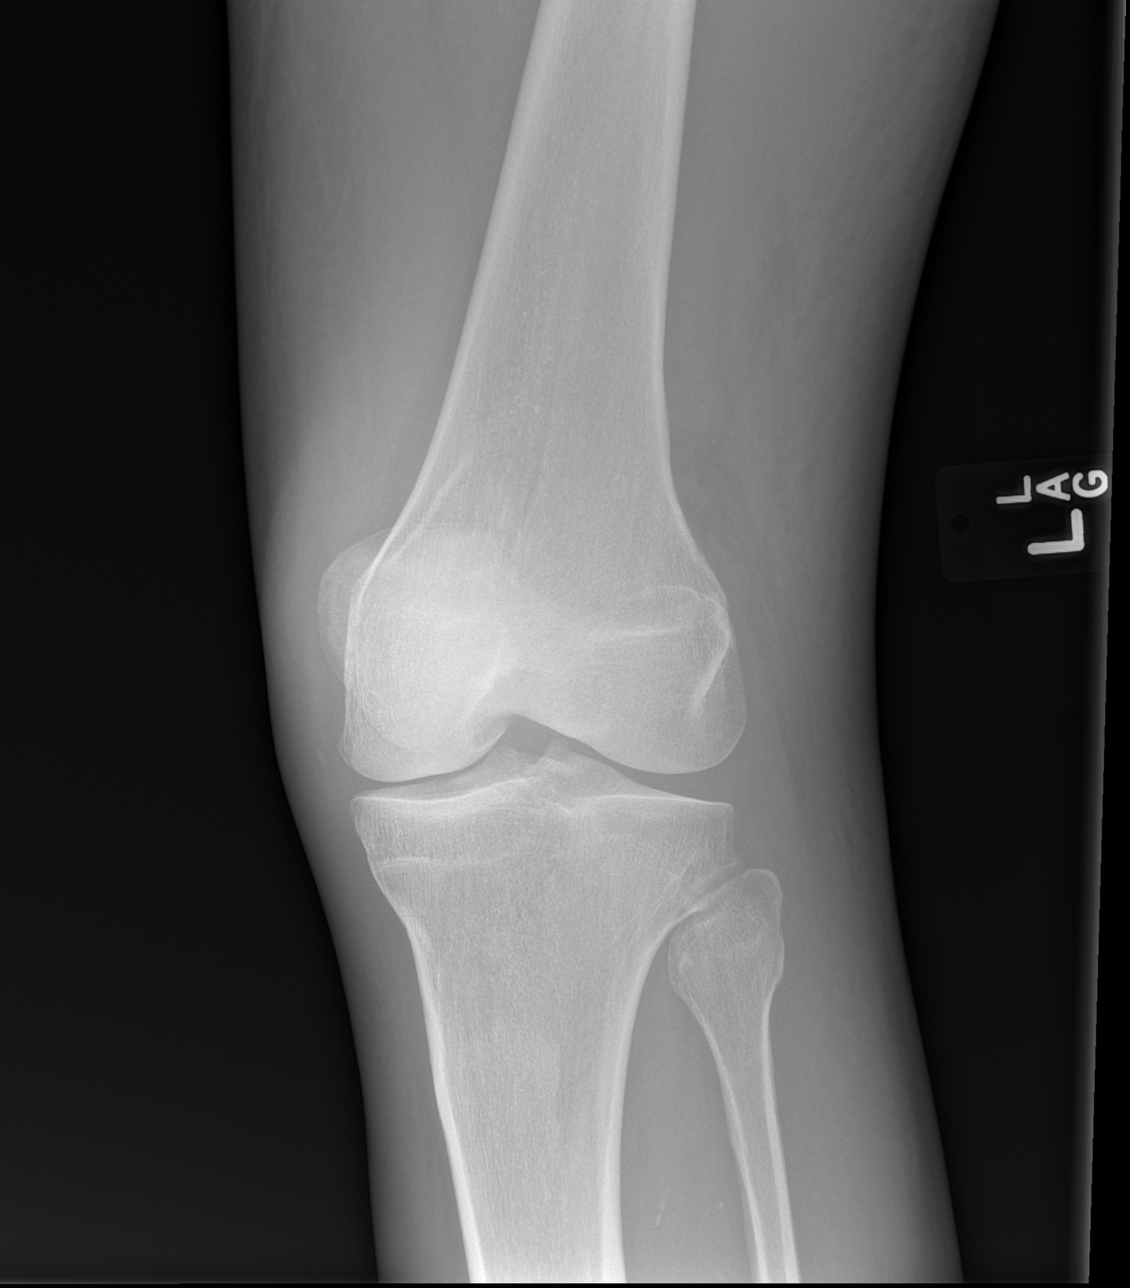

[x knee obl left (2 of 2)]
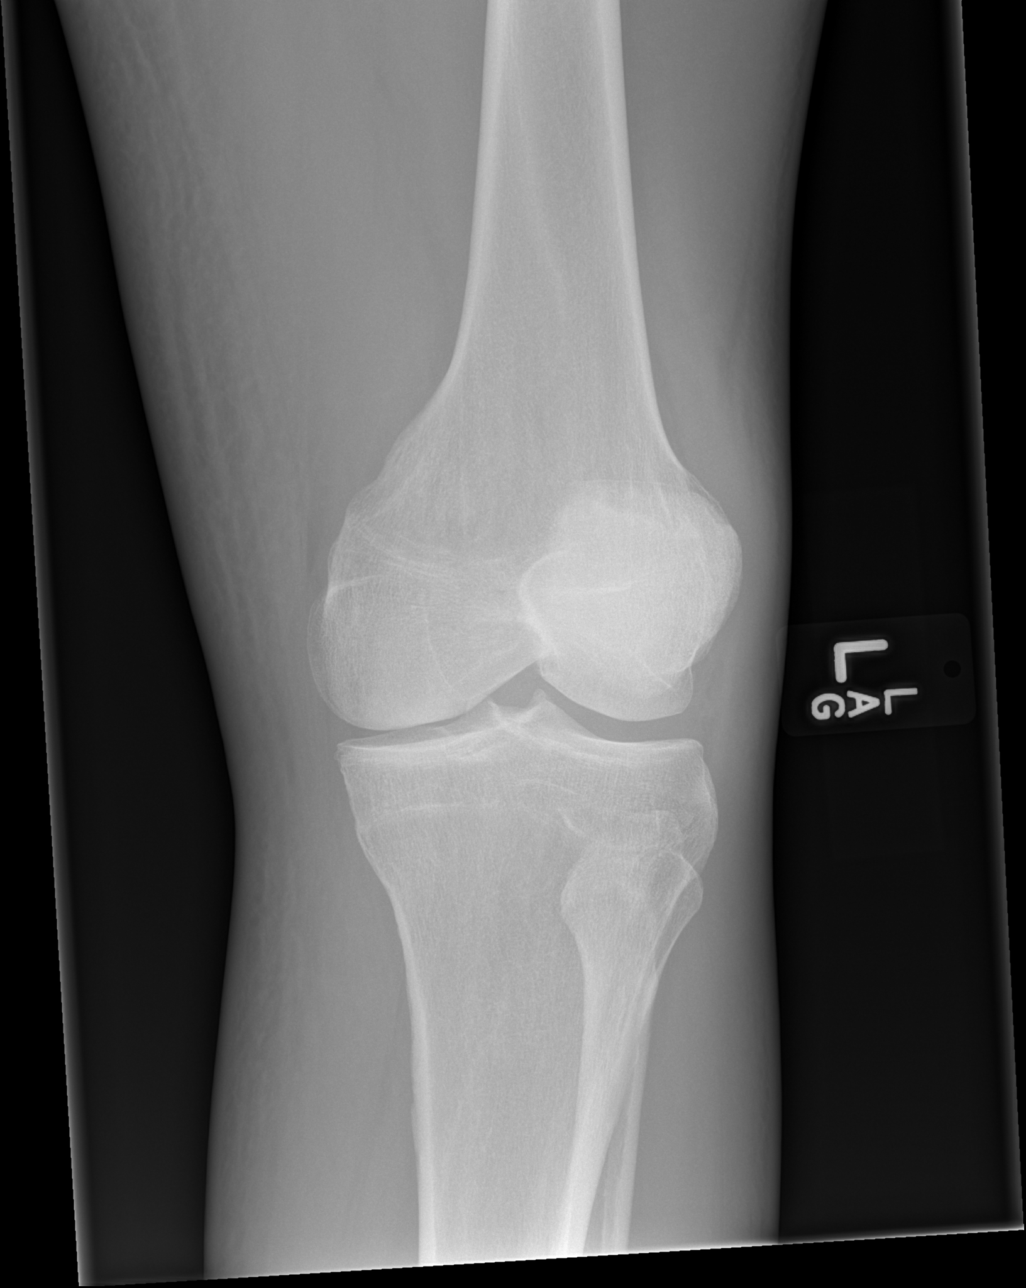

[x knee lat left]
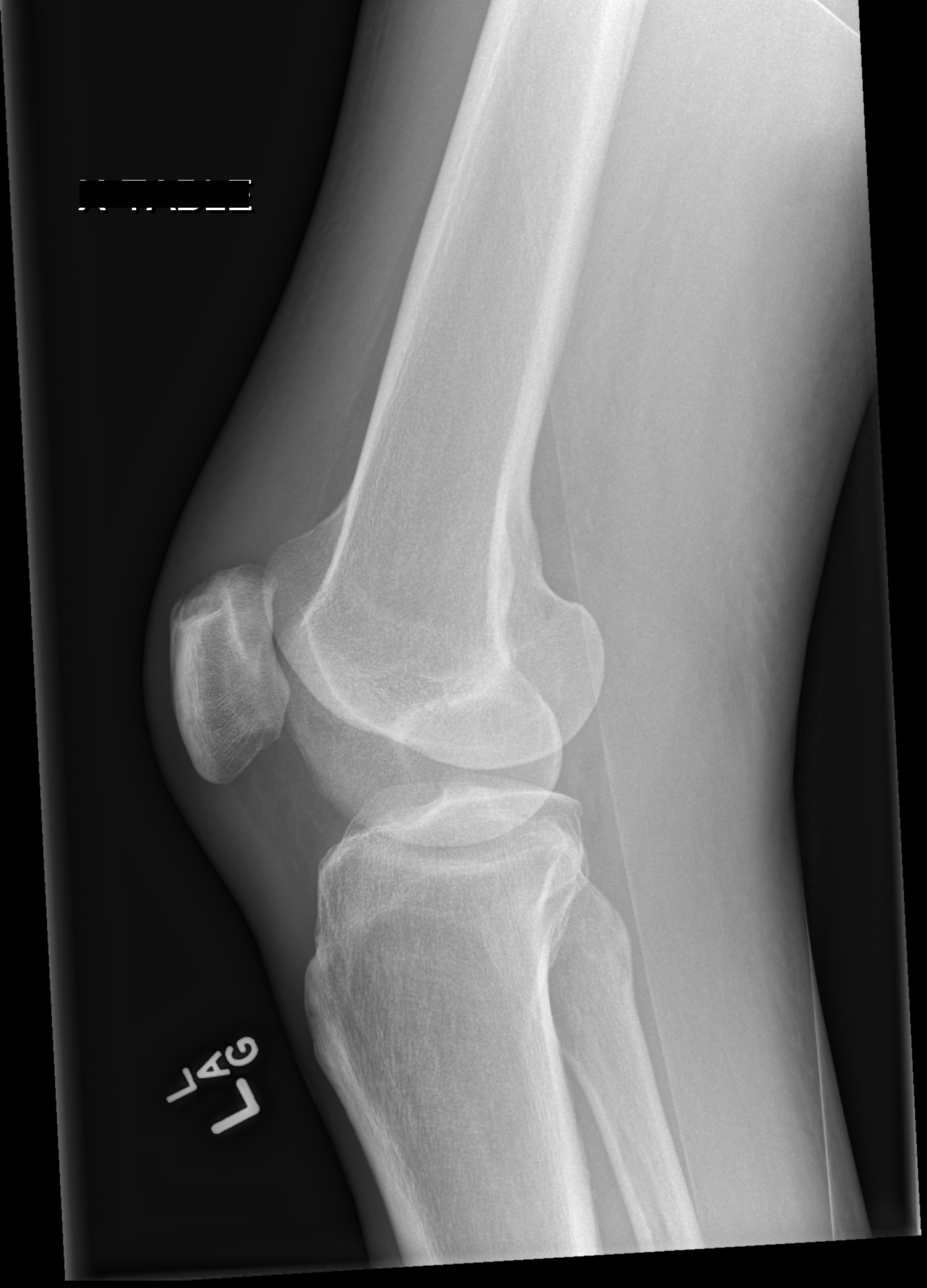

[4 of 4 positions shown; findings below may reference images not displayed]

FINDINGS: Soft tissue induration and swelling is noted about the visualized
thigh, knee and proximal calf. There is medial femorotibial joint
space narrowing with minimal spurring of the lateral tibial spine.
No joint effusion. No acute fracture bone destruction. Suggestion of
mild periosteum reaction along the proximal tibial shaft raising the
question of venous stasis.
IMPRESSION: Nonspecific diffuse soft tissue swelling about the left knee
question venous stasis given periosteal thickening along the tibial
shaft.

## 2019-05-17 DEATH — deceased
